# Patient Record
Sex: Female | Born: 1985 | Race: Black or African American | Hispanic: No | Marital: Single | Smoking: Current every day smoker
Health system: Southern US, Community
[De-identification: ages and names within clinical notes are randomized; demographics above are authoritative.]

## PROBLEM LIST (undated history)

## (undated) DIAGNOSIS — I471 Supraventricular tachycardia, unspecified: Secondary | ICD-10-CM

## (undated) DIAGNOSIS — R002 Palpitations: Secondary | ICD-10-CM

## (undated) DIAGNOSIS — H16009 Unspecified corneal ulcer, unspecified eye: Secondary | ICD-10-CM

## (undated) HISTORY — PX: TUBAL LIGATION: SHX77

## (undated) HISTORY — PX: ELBOW SURGERY: SHX618

---

## 2015-06-21 ENCOUNTER — Emergency Department (HOSPITAL_BASED_OUTPATIENT_CLINIC_OR_DEPARTMENT_OTHER)
Admission: EM | Admit: 2015-06-21 | Discharge: 2015-06-21 | Disposition: A | Discharge status: Home / Self Care | Payer: Medicaid Other | Attending: Emergency Medicine | Admitting: Emergency Medicine

## 2015-06-21 ENCOUNTER — Encounter (HOSPITAL_BASED_OUTPATIENT_CLINIC_OR_DEPARTMENT_OTHER): Payer: Self-pay | Admitting: Emergency Medicine

## 2015-06-21 DIAGNOSIS — R51 Headache: Secondary | ICD-10-CM | POA: Insufficient documentation

## 2015-06-21 DIAGNOSIS — Z720 Tobacco use: Secondary | ICD-10-CM | POA: Insufficient documentation

## 2015-06-21 DIAGNOSIS — R220 Localized swelling, mass and lump, head: Secondary | ICD-10-CM | POA: Diagnosis present

## 2015-06-21 DIAGNOSIS — H16001 Unspecified corneal ulcer, right eye: Secondary | ICD-10-CM

## 2015-06-21 HISTORY — DX: Unspecified corneal ulcer, unspecified eye: H16.009

## 2015-06-21 MED ORDER — HYDROCODONE-ACETAMINOPHEN 5-325 MG PO TABS: 2.0000 | ORAL_TABLET | ORAL | Status: DC | PRN

## 2015-06-21 MED ORDER — ONDANSETRON HCL 4 MG/2ML IJ SOLN
4.0000 mg | Freq: Once | INTRAMUSCULAR | Status: AC
Start: 2015-06-21 — End: 2015-06-21
  Administered 2015-06-21: 4 mg via INTRAVENOUS
  Filled 2015-06-21: qty 2

## 2015-06-21 MED ORDER — TETRACAINE HCL 0.5 % OP SOLN
OPHTHALMIC | Status: AC
  Administered 2015-06-21: 2 [drp] via OPHTHALMIC
  Filled 2015-06-21: qty 2

## 2015-06-21 MED ORDER — FLUORESCEIN SODIUM 1 MG OP STRP
1.0000 | ORAL_STRIP | Freq: Once | OPHTHALMIC | Status: AC
  Administered 2015-06-21: 1 via OPHTHALMIC

## 2015-06-21 MED ORDER — MORPHINE SULFATE (PF) 4 MG/ML IV SOLN
4.0000 mg | INTRAVENOUS | Status: DC | PRN
  Administered 2015-06-21: 4 mg via INTRAVENOUS

## 2015-06-21 MED ORDER — MORPHINE SULFATE (PF) 2 MG/ML IV SOLN
2.0000 mg | Freq: Once | INTRAVENOUS | Status: AC
  Administered 2015-06-21: 2 mg via INTRAVENOUS
  Filled 2015-06-21: qty 1

## 2015-06-21 MED ORDER — FLUORESCEIN SODIUM 1 MG OP STRP
ORAL_STRIP | OPHTHALMIC | Status: AC
Start: 2015-06-21 — End: 2015-06-21
  Administered 2015-06-21: 1 via OPHTHALMIC
  Filled 2015-06-21: qty 1

## 2015-06-21 MED ORDER — TETRACAINE HCL 0.5 % OP SOLN
1.0000 [drp] | Freq: Once | OPHTHALMIC | Status: AC
  Administered 2015-06-21: 2 [drp] via OPHTHALMIC

## 2015-06-21 MED ORDER — MORPHINE SULFATE (PF) 2 MG/ML IV SOLN
INTRAVENOUS | Status: AC
  Filled 2015-06-21: qty 1

## 2015-06-21 MED ORDER — MORPHINE SULFATE (PF) 4 MG/ML IV SOLN
4.0000 mg | INTRAVENOUS | Status: DC | PRN
  Filled 2015-06-21: qty 1

## 2015-06-21 NOTE — Discharge Instructions (Signed)
Good directly to Dr. Eliane Decree office. He will be there waiting for you.  Do not stop, do not eat, do not go home before going directly to his office.

## 2015-06-21 NOTE — ED Notes (Signed)
Pt reports pain to jaw Friday, yesterday she developed edema to right side of face, states she feels like her eye is going to fall out

## 2015-06-21 NOTE — ED Provider Notes (Signed)
CSN: [id number]     Arrival date & time 06/21/15  8295 History   First MD Initiated Contact with Patient 06/21/15 1005     Chief Complaint  Patient presents with  . Facial Swelling      HPI  Patient presents for evaluation of right eye pain and right-sided headache. She states that she noticed some symptoms on Friday. This was 2 days ago. Symptoms worsening yesterday with worsening right-sided eye pain. Overall pain and right-sided headache. Her vision became acutely poor yesterday. Rash lesions or vesicles across her face. She states it makes her feel like her whole head and neck are hurting. No fevers or chills. Had a history of similar episode treated with "drops and medications" a year or so ago she cannot recall whether this was herpetic lesion, ulcer, shingles. Cannot specify.   Past Medical History  Diagnosis Date  . Corneal ulcer    History reviewed. No pertinent past surgical history. History reviewed. No pertinent family history. Social History  Substance Use Topics  . Smoking status: Current Every Day Smoker -- 0.50 packs/day    Types: Cigarettes  . Smokeless tobacco: None  . Alcohol Use: No   OB History    No data available     Review of Systems  Constitutional: Negative for fever, chills, diaphoresis, appetite change and fatigue.  HENT: Negative for mouth sores, sore throat and trouble swallowing.        Headache right eye pain.  Eyes: Positive for photophobia, redness and visual disturbance.  Respiratory: Negative for cough, chest tightness, shortness of breath and wheezing.   Cardiovascular: Negative for chest pain.  Gastrointestinal: Negative for nausea, vomiting, abdominal pain, diarrhea and abdominal distention.  Endocrine: Negative for polydipsia, polyphagia and polyuria.  Genitourinary: Negative for dysuria, frequency and hematuria.  Musculoskeletal: Negative for gait problem.  Skin: Negative for color change, pallor and rash.  Neurological: Negative  for dizziness, syncope, light-headedness and headaches.  Hematological: Does not bruise/bleed easily.  Psychiatric/Behavioral: Negative for behavioral problems and confusion.      Allergies  Review of patient's allergies indicates no known allergies.  Home Medications   Prior to Admission medications   Medication Sig Start Date End Date Taking? Authorizing Provider  ibuprofen (ADVIL,MOTRIN) 200 MG tablet Take 200 mg by mouth every 6 (six) hours as needed.   Yes Historical Provider, MD  HYDROcodone-acetaminophen (NORCO/VICODIN) 5-325 MG per tablet Take 2 tablets by mouth every 4 (four) hours as needed. 06/21/15   Rolland Porter, MD   BP 115/72 mmHg  Pulse 85  Temp(Src) 98.7 F (37.1 C) (Oral)  Resp 16  Ht  (1.575 m)  Wt 149 lb (67.586 kg)  BMI 27.25 kg/m2  SpO2 98%  LMP 05/22/2015 Physical Exam  Constitutional: She is oriented to person, place, and time. She appears well-developed and well-nourished. No distress.  HENT:  Head: Normocephalic.  Eyes: Conjunctivae are normal. Pupils are equal, round, and reactive to light. No scleral icterus.  Slit lamp exam:      The right eye shows corneal ulcer and fluorescein uptake. The right eye shows no hyphema and no hypopyon.    Large corneal ulcer nearly occluding/covering the pupillary aperture in a dimly lit room.  Direct and consensual photophobia. No cloudiness of the anterior chamber. No cell or flare noted. Pupils reactive. Eye does not appear to be an acute angle closure, coma.  Neck: Normal range of motion. Neck supple. No thyromegaly present.  Cardiovascular: Normal rate and regular rhythm.  Exam reveals no gallop and no friction rub.   No murmur heard. Pulmonary/Chest: Effort normal and breath sounds normal. No respiratory distress. She has no wheezes. She has no rales.  Abdominal: Soft. Bowel sounds are normal. She exhibits no distension. There is no tenderness. There is no rebound.  Musculoskeletal: Normal range of motion.    Neurological: She is alert and oriented to person, place, and time.  Skin: Skin is warm and dry. No rash noted.  Psychiatric: She has a normal mood and affect. Her behavior is normal.    ED Course  Procedures (including critical care time) Labs Review Labs Reviewed - No data to display  Imaging Review No results found. I have personally reviewed and evaluated these images and lab results as part of my medical decision-making.   EKG Interpretation None      MDM   Final diagnoses:  Corneal ulcer, right    Patient is unable to complete visual acuity testing if she cannot identify any chart. She can identify colors movement and light. She has a large central ulcers does not appear dendritic. I discussed the case with Dr. Allena Katz. Dr. Allena Katz as always return my call immediately and was really helpful. He will see the patient in his office immediately. The patient was discharged to follow-up directly at his office. Asked to not stop, eat, or take any alternate routes, and going directly to his office for immediate evaluation of this large corneal ulcer.    Rolland Porter, MD 06/21/15 [phone]

## 2015-06-26 ENCOUNTER — Emergency Department (HOSPITAL_COMMUNITY)
Admission: EM | Admit: 2015-06-26 | Discharge: 2015-06-27 | Disposition: A | Discharge status: Home / Self Care | Payer: Medicaid Other | Attending: Emergency Medicine | Admitting: Emergency Medicine

## 2015-06-26 ENCOUNTER — Encounter (HOSPITAL_COMMUNITY): Payer: Self-pay | Admitting: Emergency Medicine

## 2015-06-26 DIAGNOSIS — Z720 Tobacco use: Secondary | ICD-10-CM | POA: Insufficient documentation

## 2015-06-26 DIAGNOSIS — H16001 Unspecified corneal ulcer, right eye: Secondary | ICD-10-CM | POA: Insufficient documentation

## 2015-06-26 DIAGNOSIS — Z792 Long term (current) use of antibiotics: Secondary | ICD-10-CM | POA: Diagnosis not present

## 2015-06-26 DIAGNOSIS — H5711 Ocular pain, right eye: Secondary | ICD-10-CM | POA: Diagnosis present

## 2015-06-26 DIAGNOSIS — G478 Other sleep disorders: Secondary | ICD-10-CM | POA: Diagnosis not present

## 2015-06-26 MED ORDER — DIAZEPAM 5 MG PO TABS
5.0000 mg | ORAL_TABLET | Freq: Once | ORAL | Status: AC
  Administered 2015-06-26: 5 mg via ORAL
  Filled 2015-06-26: qty 1

## 2015-06-26 MED ORDER — HYDROCODONE-ACETAMINOPHEN 5-325 MG PO TABS: 1.0000 | ORAL_TABLET | ORAL | Status: DC | PRN

## 2015-06-26 MED ORDER — HYDROMORPHONE HCL 1 MG/ML IJ SOLN
1.0000 mg | Freq: Once | INTRAMUSCULAR | Status: AC
  Administered 2015-06-26: 1 mg via INTRAMUSCULAR
  Filled 2015-06-26: qty 1

## 2015-06-26 MED ORDER — IBUPROFEN 200 MG PO TABS
600.0000 mg | ORAL_TABLET | Freq: Once | ORAL | Status: AC
  Administered 2015-06-26: 600 mg via ORAL
  Filled 2015-06-26: qty 3

## 2015-06-26 NOTE — Discharge Instructions (Signed)
Corneal Ulcer A corneal ulcer is an open sore on the cornea. The cornea is the clear covering at the front and center of the eye.  CAUSES  Most corneal ulcers are caused by infection, but there are other causes as well.  Bacterial infection. A bacterial infection can occur and cause a corneal ulcer if:  Contact lenses are worn too long (especially overnight) or are not properly cared for.  An eye injury occurs, allowing bacteria to infect the area of injury.  Viral infection. A viral infection can occur and cause a corneal ulcer if:  The eye becomes infected with a virus, such as the herpes simplex (cold sore) virus, chickenpox virus, or shingles virus.  Fungal infection. A fungal infection can occur and cause a corneal ulcer if:  An eye injury resulted from contact with a plant or plant material.  An anti-inflammatory eye drop is overused.  You have a weakened immune system.  Contact lenses are improperly cared for or become infected.  Foreign bodies in the eye, such as sand, glass, or small pieces of glass or metal.  Dry eyes.  Certain disorders that prevent eyelids from closing completely, such as Bell's palsy.  Contact lenses, especially extended-wear soft contact lenses. Contact lenses can:  Scratch the cornea's surface, allowing bacteria to enter the scratch.  Trap dirt underneath the contact lens, which can scratch the cornea.  Harbor bacteria and fungi, making it more likely for bacterial infections to occur.  Block oxygen from the cornea, making it more likely for infections to occur. SYMPTOMS   Eye pain that is often severe.  Blurry vision.  Light sensitivity.  Pus or thick discharge coming from your eye.  Eye redness.  Feeling like something is in your eye.  Watery or itchy eye.  Burning or stinging feeling. Some ulcers that are very big may be seen as a white spot on the cornea. DIAGNOSIS  An eye exam will be performed. Your health care provider  may use a special kind of microscope (slit lamp) to look at the cornea. Eye drops may be put into the eye to make the ulcer easier to see. If it is suspected that an infection caused the corneal ulcer, tissue samples or cultures from the eye may be taken. Numbing eye drops will be given before any samples or cultures are taken. The samples or cultures will be examined in the lab to check for bacteria, viruses, or fungi. TREATMENT  Treatment of the corneal ulcer depends on the cause. If your ulcer is severe, you may be given antibiotic eye drops up until your health care provider knows the test results. Other treatments can include:  Antibacterial, antiviral, or antifungal eye drops or ointment.  Removing the foreign body that caused the eye injury.  Artificial tears or a bandage contact lens if severe dry eyes caused the corneal ulcer.  Over-the-counter or prescription pain medicine.  Steroidal eye drops if the eye is inflamed and swollen.  Antibiotic medicines by mouth.  An injection of medicine under the thin membrane covering the eyeball (conjunctiva). This allows medicine to reach the ulcer in high doses.  Eye patching to reduce irritation from blinking and bright light. An eye patch may not be given if the ulcer was caused by a bacterial infection. If the corneal ulcer causes a scar on the cornea that interferes with vision, hospitalization and surgery may be needed to replace the cornea (corneal transplant). HOME CARE INSTRUCTIONS   If prescribed, use your antibiotic   pills, eye drops, or ointment as directed. Continue using them even if you start to feel better. You may have to apply eye drops as often as every few minutes to every hour, for days. It may be necessary to set your alarm clock every few minutes to every hour during the night. This is absolutely necessary.  Only take over-the-counter or prescription medicines as directed by your health care provider.  Apply artificial  tears as needed if you have dry eyes.  Do not touch or rub your eye, because this may increase the irritation and spread the infection.  Avoid wearing eye makeup.  Stay in a dark room and use sunglasses if you have light sensitivity.  Apply cool packs to your eye to relieve discomfort and swelling.  If your eye is patched, you should not drive or use machinery. You will have reduced side vision and ability to judge distance.  Do not drive or operate machinery until approved by your health care provider. Your ability to judge distances is impaired.  Follow up with your health care provider as directed.  Do not wear contact lenses until your health care provider approves. If you normally wear contact lenses, follow these general rules to avoid the risk of a corneal ulcer:  Do not wear contact lenses while you sleep.  Wash your hands before removing contact lenses.  Properly sterilize and store your contact lenses.  Regularly clean your contact lens case.  Do not use your saliva or tap water to clean or wet your contact lenses.  Remove your contact lenses if your eye becomes irritated. You may put them back in once your eyes feel better. SEEK IMMEDIATE MEDICAL CARE IF:   You notice a change in your vision.  Your pain is getting worse, not better.  You have increasing discharge from the eye. MAKE SURE YOU:   Understand these instructions.  Will watch your condition.  Will get help right away if you are not doing well or get worse. Document Released: 10/27/2004 Document Revised: 05/22/2013 Document Reviewed: 02/19/2013 [employer] Patient Information 2015 [employer]. This information is not intended to replace advice given to you by your health care provider. Make sure you discuss any questions you have with your health care provider.  

## 2015-06-26 NOTE — ED Notes (Signed)
Pt was seen at eye doctor's office on Wednesday. Was supposed to pick up pain medicine and an eye ointment but says, "When I went to pick up the eye ointment, they said something about my insurance and that it was too expensive. Nothing helps the pain and I don't know what to do. It's giving me a migraine." No other c/c.

## 2015-06-26 NOTE — ED Provider Notes (Signed)
CSN: [id number]     Arrival date & time 06/26/15  2201 History  This chart was scribed for Raeford Razor, MD by Placido Sou, ED scribe. This patient was seen in room WA12/[facility]12 and the patient's care was started at 11:06 PM.   Chief Complaint  Patient presents with  . Corneal Ulcer    . Eye Pain  . Migraine   The history is provided by the patient. No language interpreter was used.    HPI Comments: Tiffany Patterson is a 29 y.o. female who presents to the Emergency Department complaining of constant, moderate, right eye pain with onset 8 days ago. Pt was seen previously for her right eye pain 3 days ago and was diagnosed with a corneal ulcer and confirms taking some of her prescriptions including doxycycline and bacitracin. Pt notes that she still has a constant, moderate, associated HA, trouble sleeping as well as worsening symptoms when exposed to bright light. Pt notes taking hydrocodone for pain management with her last dose today at 3:00 pm which provided little relief of her pain. She notes contact lense use and further notes having a follow up appointment with her provider in 2 days. Pt denies any other associated symptoms.   Past Medical History  Diagnosis Date  . Corneal ulcer    History reviewed. No pertinent past surgical history. History reviewed. No pertinent family history. Social History  Substance Use Topics  . Smoking status: Current Every Day Smoker -- 0.50 packs/day    Types: Cigarettes  . Smokeless tobacco: None  . Alcohol Use: No   OB History    No data available     Review of Systems  Eyes: Positive for photophobia, pain and visual disturbance.  Neurological: Positive for headaches.  Psychiatric/Behavioral: Positive for sleep disturbance.  All other systems reviewed and are negative.  Allergies  Review of patient's allergies indicates no known allergies.  Home Medications   Prior to Admission medications   Medication Sig Start Date End Date Taking?  Authorizing Provider  doxycycline (VIBRA-TABS) 100 MG tablet Take 100 mg by mouth daily.   Yes Historical Provider, MD  KLOR-CON M20 20 MEQ tablet Take 40 mEq by mouth daily. 06/22/15  Yes Historical Provider, MD  metoprolol tartrate (LOPRESSOR) 25 MG tablet Take 25 mg by mouth 2 (two) times daily. 06/22/15  Yes Historical Provider, MD  VIGAMOX 0.5 % ophthalmic solution Place 1 drop into the right eye every hour. 06/21/15  Yes Historical Provider, MD  HYDROcodone-acetaminophen (NORCO/VICODIN) 5-325 MG per tablet Take 2 tablets by mouth every 4 (four) hours as needed. Patient not taking: Reported on 06/26/2015 06/21/15   Rolland Porter, MD   BP 107/74 mmHg  Pulse 82  Temp(Src) 98.2 F (36.8 C) (Oral)  Resp 20  SpO2 99%  LMP 05/22/2015 Physical Exam  Constitutional: She is oriented to person, place, and time. She appears well-developed and well-nourished.  HENT:  Head: Normocephalic and atraumatic.  Mouth/Throat: No oropharyngeal exudate.  Eyes:  Slit lamp exam:      The right eye shows corneal ulcer.  Large corneal ulcer right eye; conjunctival injection; mild upper lid edema; severe photophobia  Neck: Normal range of motion. No tracheal deviation present.  Cardiovascular: Normal rate.   Pulmonary/Chest: Effort normal. No respiratory distress.  Abdominal: Soft. There is no tenderness.  Musculoskeletal: Normal range of motion.  Neurological: She is alert and oriented to person, place, and time.  Skin: Skin is warm and dry. She is not diaphoretic.  Psychiatric: She  has a normal mood and affect. Her behavior is normal.  Nursing note and vitals reviewed.  ED Course  Procedures  DIAGNOSTIC STUDIES: Oxygen Saturation is 99% on RA, normal by my interpretation.    COORDINATION OF CARE: 11:15 PM Discussed treatment plan with pt at bedside and pt agreed to plan.  Labs Review Labs Reviewed - No data to display  Imaging Review No results found. I have personally reviewed and evaluated these  images and lab results as part of my medical decision-making.   EKG Interpretation None      MDM   Final diagnoses:  Corneal ulcer, right    28yF with corneal ulcer. Under opthalmology care. Pain poorly controlled. Additional meds added. Close outpt FU.  I personally preformed the services scribed in my presence. The recorded information has been reviewed is accurate. Raeford Razor, MD.    Raeford Razor, MD 07/04/15 [id number]

## 2015-06-27 MED ORDER — DIAZEPAM 5 MG PO TABS: 5.0000 mg | ORAL_TABLET | Freq: Three times a day (TID) | ORAL | Status: DC | PRN

## 2015-06-27 NOTE — ED Notes (Signed)
Pt. Ready for discharged, still in room awaiting for her Valium prescription, MD notified.

## 2015-11-06 ENCOUNTER — Emergency Department (HOSPITAL_BASED_OUTPATIENT_CLINIC_OR_DEPARTMENT_OTHER)
Admission: EM | Admit: 2015-11-06 | Discharge: 2015-11-06 | Disposition: A | Discharge status: Home / Self Care | Payer: Medicaid Other | Attending: Emergency Medicine | Admitting: Emergency Medicine

## 2015-11-06 ENCOUNTER — Emergency Department (HOSPITAL_BASED_OUTPATIENT_CLINIC_OR_DEPARTMENT_OTHER): Payer: Medicaid Other

## 2015-11-06 ENCOUNTER — Encounter (HOSPITAL_BASED_OUTPATIENT_CLINIC_OR_DEPARTMENT_OTHER): Payer: Self-pay

## 2015-11-06 DIAGNOSIS — Z79899 Other long term (current) drug therapy: Secondary | ICD-10-CM | POA: Diagnosis not present

## 2015-11-06 DIAGNOSIS — R079 Chest pain, unspecified: Secondary | ICD-10-CM | POA: Insufficient documentation

## 2015-11-06 DIAGNOSIS — Z8669 Personal history of other diseases of the nervous system and sense organs: Secondary | ICD-10-CM | POA: Diagnosis not present

## 2015-11-06 DIAGNOSIS — F17210 Nicotine dependence, cigarettes, uncomplicated: Secondary | ICD-10-CM | POA: Diagnosis not present

## 2015-11-06 DIAGNOSIS — Z8679 Personal history of other diseases of the circulatory system: Secondary | ICD-10-CM | POA: Insufficient documentation

## 2015-11-06 DIAGNOSIS — J069 Acute upper respiratory infection, unspecified: Secondary | ICD-10-CM

## 2015-11-06 DIAGNOSIS — R05 Cough: Secondary | ICD-10-CM | POA: Diagnosis present

## 2015-11-06 HISTORY — DX: Supraventricular tachycardia: I47.1

## 2015-11-06 HISTORY — DX: Palpitations: R00.2

## 2015-11-06 HISTORY — DX: Supraventricular tachycardia, unspecified: I47.10

## 2015-11-06 LAB — CBC WITH DIFFERENTIAL/PLATELET
BASOS ABS: 0 10*3/uL (ref 0.0–0.1)
BASOS PCT: 0 %
EOS PCT: 1 %
Eosinophils Absolute: 0.1 10*3/uL (ref 0.0–0.7)
HEMATOCRIT: 38.7 % (ref 36.0–46.0)
Hemoglobin: 13 g/dL (ref 12.0–15.0)
Lymphocytes Relative: 31 %
Lymphs Abs: 2.1 10*3/uL (ref 0.7–4.0)
MCH: 33.5 pg (ref 26.0–34.0)
MCHC: 33.6 g/dL (ref 30.0–36.0)
MCV: 99.7 fL (ref 78.0–100.0)
MONO ABS: 0.6 10*3/uL (ref 0.1–1.0)
Monocytes Relative: 8 %
NEUTROS ABS: 4.1 10*3/uL (ref 1.7–7.7)
Neutrophils Relative %: 60 %
PLATELETS: 209 10*3/uL (ref 150–400)
RBC: 3.88 MIL/uL (ref 3.87–5.11)
RDW: 11.6 % (ref 11.5–15.5)
WBC: 6.9 10*3/uL (ref 4.0–10.5)

## 2015-11-06 LAB — BASIC METABOLIC PANEL
ANION GAP: 7 (ref 5–15)
BUN: 12 mg/dL (ref 6–20)
CO2: 23 mmol/L (ref 22–32)
CREATININE: 0.6 mg/dL (ref 0.44–1.00)
Calcium: 8.9 mg/dL (ref 8.9–10.3)
Chloride: 109 mmol/L (ref 101–111)
GFR calc non Af Amer: >60 mL/min (ref >60)
Glucose, Bld: 88 mg/dL (ref 65–99)
Potassium: 4 mmol/L (ref 3.5–5.1)
Sodium: 139 mmol/L (ref 135–145)

## 2015-11-06 LAB — RAPID STREP SCREEN (MED CTR MEBANE ONLY): STREPTOCOCCUS, GROUP A SCREEN (DIRECT): NEGATIVE

## 2015-11-06 LAB — TROPONIN I: Troponin I: <0.03 ng/mL (ref <0.031)

## 2015-11-06 MED ORDER — BENZONATATE 100 MG PO CAPS: 100.0000 mg | ORAL_CAPSULE | Freq: Three times a day (TID) | ORAL | Status: AC

## 2015-11-06 MED ORDER — IBUPROFEN 800 MG PO TABS
800.0000 mg | ORAL_TABLET | Freq: Once | ORAL | Status: AC
Start: 2015-11-06 — End: 2015-11-06
  Administered 2015-11-06: 800 mg via ORAL
  Filled 2015-11-06: qty 1

## 2015-11-06 MED FILL — BENZONATATE 100 MG CAPSULE: 100 | 7 days supply | Qty: 21 | Fill #0

## 2015-11-06 NOTE — ED Notes (Addendum)
C/o dry cough, prod in the am, chills, sweats x 2 days-NAD-steady gait-also c/o CP x 2 days-hx of palpitations-was seen by cards last week for same-wore heart monitor x 2 days

## 2015-11-06 NOTE — Discharge Instructions (Signed)
1. Medications: tessalon, tylenol or motrin for pain, usual home medications 2. Treatment: rest, drink plenty of fluids 3. Follow Up: please followup with your primary doctor for discussion of your diagnoses and further evaluation after today's visit; if you do not have a primary care doctor use the resource guide provided to find one; please return to the ER for high fever, severe pain, shortness of breath, new or worsening symptoms   Upper Respiratory Infection, Adult Most upper respiratory infections (URIs) are a viral infection of the air passages leading to the lungs. A URI affects the nose, throat, and upper air passages. The most common type of URI is nasopharyngitis and is typically referred to as "the common cold." URIs run their course and usually go away on their own. Most of the time, a URI does not require medical attention, but sometimes a bacterial infection in the upper airways can follow a viral infection. This is called a secondary infection. Sinus and middle ear infections are common types of secondary upper respiratory infections. Bacterial pneumonia can also complicate a URI. A URI can worsen asthma and chronic obstructive pulmonary disease (COPD). Sometimes, these complications can require emergency medical care and may be life threatening.  CAUSES Almost all URIs are caused by viruses. A virus is a type of germ and can spread from one person to another.  RISKS FACTORS You may be at risk for a URI if:   You smoke.   You have chronic heart or lung disease.  You have a weakened defense (immune) system.   You are very young or very old.   You have nasal allergies or asthma.  You work in crowded or poorly ventilated areas.  You work in health care facilities or schools. SIGNS AND SYMPTOMS  Symptoms typically develop 2-3 days after you come in contact with a cold virus. Most viral URIs last 7-10 days. However, viral URIs from the influenza virus (flu virus) can last  14-18 days and are typically more severe. Symptoms may include:   Runny or stuffy (congested) nose.   Sneezing.   Cough.   Sore throat.   Headache.   Fatigue.   Fever.   Loss of appetite.   Pain in your forehead, behind your eyes, and over your cheekbones (sinus pain).  Muscle aches.  DIAGNOSIS  Your health care provider may diagnose a URI by:  Physical exam.  Tests to check that your symptoms are not due to another condition such as:  Strep throat.  Sinusitis.  Pneumonia.  Asthma. TREATMENT  A URI goes away on its own with time. It cannot be cured with medicines, but medicines may be prescribed or recommended to relieve symptoms. Medicines may help:  Reduce your fever.  Reduce your cough.  Relieve nasal congestion. HOME CARE INSTRUCTIONS   Take medicines only as directed by your health care provider.   Gargle warm saltwater or take cough drops to comfort your throat as directed by your health care provider.  Use a warm mist humidifier or inhale steam from a shower to increase air moisture. This may make it easier to breathe.  Drink enough fluid to keep your urine clear or pale yellow.   Eat soups and other clear broths and maintain good nutrition.   Rest as needed.   Return to work when your temperature has returned to normal or as your health care provider advises. You may need to stay home longer to avoid infecting others. You can also use a face mask  and careful hand washing to prevent spread of the virus.  Increase the usage of your inhaler if you have asthma.   Do not use any tobacco products, including cigarettes, chewing tobacco, or electronic cigarettes. If you need help quitting, ask your health care provider. PREVENTION  The best way to protect yourself from getting a cold is to practice good hygiene.   Avoid oral or hand contact with people with cold symptoms.   Wash your hands often if contact occurs.  There is no clear  evidence that vitamin C, vitamin E, echinacea, or exercise reduces the chance of developing a cold. However, it is always recommended to get plenty of rest, exercise, and practice good nutrition.  SEEK MEDICAL CARE IF:   You are getting worse rather than better.   Your symptoms are not controlled by medicine.   You have chills.  You have worsening shortness of breath.  You have brown or red mucus.  You have yellow or brown nasal discharge.  You have pain in your face, especially when you bend forward.  You have a fever.  You have swollen neck glands.  You have pain while swallowing.  You have white areas in the back of your throat. SEEK IMMEDIATE MEDICAL CARE IF:   You have severe or persistent:  Headache.  Ear pain.  Sinus pain.  Chest pain.  You have chronic lung disease and any of the following:  Wheezing.  Prolonged cough.  Coughing up blood.  A change in your usual mucus.  You have a stiff neck.  You have changes in your:  Vision.  Hearing.  Thinking.  Mood. MAKE SURE YOU:   Understand these instructions.  Will watch your condition.  Will get help right away if you are not doing well or get worse.   This information is not intended to replace advice given to you by your health care provider. Make sure you discuss any questions you have with your health care provider.   Document Released: 03/15/2001 Document Revised: 02/03/2015 Document Reviewed: 12/25/2013 Elsevier Interactive Patient Education 2016 [employer].   Emergency Department Resource Guide 1) Find a Doctor and Pay Out of Pocket Although you won't have to find out who is covered by your insurance plan, it is a good idea to ask around and get recommendations. You will then need to call the office and see if the doctor you have chosen will accept you as a new patient and what types of options they offer for patients who are self-pay. Some doctors offer discounts or will set up  payment plans for their patients who do not have insurance, but you will need to ask so you aren't surprised when you get to your appointment.  2) Contact Your Local Health Department Not all health departments have doctors that can see patients for sick visits, but many do, so it is worth a call to see if yours does. If you don't know where your local health department is, you can check in your phone book. The CDC also has a tool to help you locate your state's health department, and many state websites also have listings of all of their local health departments.  3) Find a Walk-in Clinic If your illness is not likely to be very severe or complicated, you may want to try a walk in clinic. These are popping up all over the country in pharmacies, drugstores, and shopping centers. They're usually staffed by nurse practitioners or physician assistants that have been  trained to treat common illnesses and complaints. They're usually fairly quick and inexpensive. However, if you have serious medical issues or chronic medical problems, these are probably not your best option.  No Primary Care Doctor: - Call Health Connect at  [phone] - they can help you locate a primary care doctor that  accepts your insurance, provides certain services, etc. - Physician Referral Service- [phone]  Chronic Pain Problems: Organization         Address  Phone   Notes  [facility]  [phone] Patients need to be referred by their primary care doctor.   Medication Assistance: Organization         Address  Phone   Notes  [facility] [address] [place] [place], [place] [address] [phone] --Must be a resident of [facility] -- Must have NO insurance coverage whatsoever (no Medicaid/ Medicare, etc.) -- The pt. MUST have a primary care doctor that directs their care regularly and follows them in the community   [facility]  [phone]   [employer]  [phone]    Agencies that provide inexpensive medical care: Organization         Address  Phone   Notes  [facility]  [phone]   [facility]    [phone]   [facility] [address] [place], [place] [address] [phone]   [facility] [address] [place] [place] [phone]   Planned Parenthood    [phone]   [facility]    [phone]   [facility]  [address], [place] Phone:  [phone], Fax:  [phone] Hours of Operation:  9 am - 6 pm, M-F.  Also accepts Medicaid/Medicare and self-pay.  [facility]  [address], [place] Phone: [phone], Fax: [phone]. Hours of Operation:  8:30 am - 5:30 pm, M-F.  Also accepts Medicaid and self-pay.  [facility] [place] [address], [place] Phone: [phone]   [facility] [address] Natasha Bence [place], [place] [phone], Ext. [phone] Mondays & Thursdays: 7-9 AM.  First 15 patients are seen on a first come, first serve basis.    Medicaid-accepting [facility] Providers:  Organization         Address  Phone   Notes  [facility] [address], [place] [phone] Also accepts self-pay patients.  [facility] [address] Laurell Josephs [place], [place]  [phone]   [facility] [address], [place] [phone]   [facility] [address] [place] [phone]   Renaye Rakers [address], [place]   [phone] Only accepts [place] Access [place] patients after they have their name applied to their card.   Self-Pay (no insurance) in [facility]:  Organization         Address  Phone   Notes  Sickle Cell Patients, [facility] [address] [place], [place]  [phone]   [facility] [address] [place], [place] [phone]   [facility] [place]  [address], [place] [phone]   [facility]/Dr. Osei-Bonsu  [address] [place] or [place]  [address] Laurell Josephs [address], [place] [phone] Phone number for both [facility] and [place] locations is the same.  [facility] [address], [place] [phone]   [facility] [address], [place] or [address] [phone]   [facility] [address] [place] [phone], phone; [phone], fax Sees patients 1st and 3rd Saturday of every month.  Must not qualify for public or private insurance (i.e. Medicaid, Medicare, [place] Health Choice, Veterans' Benefits)  Household income should be no more than 200% of the poverty level The clinic cannot treat you if you are pregnant or think you are pregnant  Sexually transmitted diseases are not treated at the clinic.    Dental Care: Organization         Address  Phone  Notes  [facility] of [facility] [address] [place], [place] [phone] Accepts children up to age 33 who are enrolled in [place] or [place] Health Choice; pregnant women with a Medicaid card; and children who have applied for Medicaid or [place] Health Choice, but were declined, whose parents can pay a reduced fee at time of service.  [facility] of [facility]  [address] [place] [address], [place] [phone] Accepts children up to age 52 who are enrolled in [place] or [place] Health Choice; pregnant women with a Medicaid card; and children who have applied for Medicaid or [place] Health Choice, but were declined, whose parents can pay a reduced fee at time of service.  [facility]  [address] [place], [place] [phone] Patients  are seen by appointment only. Walk-ins are not accepted. [facility] will see patients 67 years of age and older. Monday - Tuesday (8am-5pm) Most Wednesdays (8:30-5pm) $30 per visit, cash only  [facility]  [address], [facility] [phone] Patients are seen by appointment only. Walk-ins are not accepted. [facility] will see patients 33 years of age and older. One Wednesday Evening (Monthly: Volunteer Based).  $30 per visit, cash only  [employer]  [phone] for adults; Children under age 76, call Graduate Pediatric Dentistry at [phone]. Children aged 45-14, please call [phone] to request a pediatric application.  Dental services are provided in all areas of dental care including fillings, crowns and bridges, complete and partial dentures, implants, gum treatment, root canals, and extractions. Preventive care is also provided. Treatment is provided to both adults and children. Patients are selected via a lottery and there is often a waiting list.   [facility] [address], [place]  [phone] [email or web address]   [facility] [address] [place], [place] [phone], Ext. [phone] Second and Fourth Thursday of each month, opens at 6:30 AM; Clinic ends at 9 AM.  Patients are seen on a first-come first-served basis, and a limited number are seen during each clinic.   [facility]  [address] Ether Griffins [place], [place] [phone]   Eligibility Requirements You must have lived in [place], [place], or [place] counties for at least the last three months.   You cannot be eligible for state or federal sponsored [place], including [employer], [place], or [employer].   You generally cannot be eligible for healthcare insurance through your employer.    How to apply: Eligibility screenings are held  every Tuesday and Wednesday afternoon from 1:00 pm until  4:00 pm. You do not need an appointment for the interview!  [facility] [address] [place], [place] [phone]   [facility]  [phone]   [facility]  [phone]   [facility]  [phone]    Behavioral Health Resources in the Community: Intensive Outpatient Programs Organization         Address  Phone  Notes  [facility] [address], [place], [place] [phone]   [facility] Outpatient [address] [place], [place] [phone]   [facility] [address], [place], [place]  [phone]   [facility] [address]  [place], [place] [phone] or [phone]   Substance Abuse Resources Organization         Address  Phone  Notes  [facility]  [phone]   [facility]  [phone]   [facility]  [phone]   Floydene Flock  [phone]   Residential & Outpatient Substance Abuse Program  [phone]   Psychological Services Organization         Address  Phone  Notes  [facility]  [phone]   [facility]  [phone]   [facility] [address], [place] [phone] or [phone]    Mobile Crisis Teams Organization         Address  Phone  Notes  [facility], Mobile Crisis Care Unit  [phone]   [facility]  [address] [place], [place] [phone]   Doristine Locks [address] [place] [phone]    Self-Help/Support Groups Organization         Address  Phone             Notes  [facility] [place] - variety of support groups  336- I7437963 Call for more information  Narcotics Anonymous (NA), [facility] [address], [employer] [place]  2 meetings at this location   Statistician          Address  Phone  Notes  [facility] [address],    [place]  [phone]   [facility] [place] [facility]  [address] [place] [address], [place], [place] [phone]   [facility] [address] [place], [place] [phone] Admissions: 8am-3pm M-F  [facility] [address]    [place], [place] [phone]   [facility] [address] [place], [place], [place] [phone]   [facility] [address],  [place], [place] [phone]   [facility] - Intensive Outpatient [address] Laurell Josephs [address], [place], [place] [phone]   [facility] ([facility] [address] [place]  [place], [place] [phone] or [phone]   [facility] ([facility]) [address] [place], [place] [phone] Accepts Medicaid  [facility] [place] [address]  [place] [phone] Substance Abuse/Addiction Treatment   [facility] Organization         Address  Phone  Notes  [facility]  [phone]   Angie Fava, PhD [address] Ervin Knack [place], [place]   [phone] or [phone]   [facility] Behavioral   [address] [place], [place] [phone]   [facility] [address], [place], [place] [phone] Insurance/Medicaid/sponsorship through  [facility] [address] [place], [place] [phone] [place], [place] [phone]    Dr. Adele Schilder  [phone]   [facility] Dept. 1) [address] [place] 2) [place] 3)  [address], [place] [phone]  [phone]   [place] [phone] or [phone] (After Hours)

## 2015-11-06 NOTE — ED Provider Notes (Signed)
CSN: [id number]     Arrival date & time 11/06/15  1131 History   First MD Initiated Contact with Patient 11/06/15 1215     Chief Complaint  Patient presents with  . Cough  . Chest Pain    HPI   Tiffany Patterson is a 30 y.o. female with a PMH of SVT who presents to the ED with nasal congestion, nonproductive cough, chest pain, and shortness of breath, which she states has been present since Wednesday. She states her symptoms are constant. She reports coughing exacerbates her chest pain and shortness of breath. She has not tried anything for symptom relief. She denies fever, though reports chills. She also notes sore throat. She denies sick contact. She denies recent travel or immobility, recent surgery, history of malignancy, history of DVT or PE, estrogen use.   Past Medical History  Diagnosis Date  . Corneal ulcer   . Palpitations   . SVT (supraventricular tachycardia) [facility])    Past Surgical History  Procedure Laterality Date  . Elbow surgery    . Tubal ligation     No family history on file. Social History  Substance Use Topics  . Smoking status: Current Every Day Smoker -- 0.50 packs/day    Types: Cigarettes  . Smokeless tobacco: None  . Alcohol Use: No   OB History    No data available      Review of Systems  Constitutional: Positive for chills. Negative for fever.  HENT: Positive for congestion and sore throat.   Respiratory: Positive for cough and shortness of breath.   Cardiovascular: Positive for chest pain.  Gastrointestinal: Negative for nausea, vomiting, abdominal pain, diarrhea and constipation.  All other systems reviewed and are negative.     Allergies  Review of patient's allergies indicates no known allergies.  Home Medications   Prior to Admission medications   Medication Sig Start Date End Date Taking? Authorizing Provider  diphenhydramine-acetaminophen (TYLENOL PM) 25-500 MG TABS tablet Take 1 tablet by mouth at bedtime as needed.   Yes  Historical Provider, MD  benzonatate (TESSALON) 100 MG capsule Take 1 capsule (100 mg total) by mouth every 8 (eight) hours. 11/06/15   Mady Gemma, PA-C  metoprolol tartrate (LOPRESSOR) 25 MG tablet Take 25 mg by mouth 2 (two) times daily. 06/22/15   Historical Provider, MD    BP 143/87 mmHg  Pulse 94  Temp(Src) 98.5 F (36.9 C) (Oral)  Resp 20  Ht  (1.575 m)  Wt 71.215 kg  BMI 28.71 kg/m2  SpO2 100%  LMP 11/01/2015 Physical Exam  Constitutional: She is oriented to person, place, and time. She appears well-developed and well-nourished. No distress.  HENT:  Head: Normocephalic and atraumatic.  Right Ear: External ear normal.  Left Ear: External ear normal.  Nose: Nose normal.  Mouth/Throat: Uvula is midline, oropharynx is clear and moist and mucous membranes are normal.  Eyes: Conjunctivae, EOM and lids are normal. Pupils are equal, round, and reactive to light. Right eye exhibits no discharge. Left eye exhibits no discharge. No scleral icterus.  Neck: Normal range of motion. Neck supple.  Cardiovascular: Normal rate, regular rhythm, normal heart sounds, intact distal pulses and normal pulses.   Pulmonary/Chest: Effort normal and breath sounds normal. No respiratory distress. She has no wheezes. She has no rales.  Abdominal: Soft. Normal appearance and bowel sounds are normal. She exhibits no distension and no mass. There is no tenderness. There is no rigidity, no rebound and no guarding.  Musculoskeletal:  Normal range of motion. She exhibits no edema or tenderness.  Neurological: She is alert and oriented to person, place, and time.  Skin: Skin is warm, dry and intact. No rash noted. She is not diaphoretic. No erythema. No pallor.  Psychiatric: She has a normal mood and affect. Her speech is normal and behavior is normal.  Nursing note and vitals reviewed.   ED Course  Procedures (including critical care time)  Labs Review Labs Reviewed  RAPID STREP SCREEN (NOT AT  [facility])  CULTURE, GROUP A STREP ([facility])  CBC WITH DIFFERENTIAL/PLATELET  BASIC METABOLIC PANEL  TROPONIN I    Imaging Review Dg Chest 2 View  11/06/2015  CLINICAL DATA:  Cough.  Chest pain EXAM: CHEST  2 VIEW COMPARISON:  None. FINDINGS: The heart size and mediastinal contours are within normal limits. Both lungs are clear. The visualized skeletal structures are unremarkable. IMPRESSION: No active cardiopulmonary disease. Electronically Signed   By: Signa Kell M.D.   On: 11/06/2015 12:51     I have personally reviewed and evaluated these images and lab results as part of my medical decision-making.   EKG Interpretation   Date/Time:  Friday November 06 2015 11:44:58 EST Ventricular Rate:  105 PR Interval:  130 QRS Duration: 76 QT Interval:  344 QTC Calculation: 454 R Axis:   23 Text Interpretation:  Sinus tachycardia with occasional Premature  ventricular complexes Otherwise normal ECG No old tracing to compare  Confirmed by [facility]  MD, MARTHA [phone]) on 11/06/2015 11:52:46 AM      MDM   Final diagnoses:  URI (upper respiratory infection)    30 year old female presents with nasal congestion, nonproductive cough, chest pain, and shortness of breath since Wednesday. Denies fever, though reports chills. Also notes sore throat. Denies sick contact, recent travel or immobility, recent surgery, history of malignancy, history of DVT or PE, estrogen use.  Patient is afebrile. Vital signs stable. Posterior oropharynx without significant erythema, edema, or exudate. Heart regular rate and rhythm. Lungs clear to auscultation bilaterally. No lower extremity edema.  CBC negative for leukocytosis or anemia. BMP unremarkable. Rapid strep negative. EKG sinus tachycardia, heart rate 105, occasional PVC. Troponin negative. Chest x-ray no active cardiopulmonary disease.  Discussed findings with patient. She is nontoxic and well-appearing, feel she is stable for discharge at this time. Symptoms  likely viral. Low suspicion for ACS. Patient is PERC negative, doubt PE. Will give cough medication for her symptoms. Advised increased fluid intake. Patient to follow-up with PCP. Return precautions discussed. Patient verbalizes her understanding and is in agreement with plan.  BP 143/87 mmHg  Pulse 94  Temp(Src) 98.5 F (36.9 C) (Oral)  Resp 20  Ht  (1.575 m)  Wt 71.215 kg  BMI 28.71 kg/m2  SpO2 100%  LMP 11/01/2015      Mady Gemma, PA-C 11/06/15 1651  Jerelyn Scott, MD 11/09/15 [phone]

## 2015-11-09 LAB — CULTURE, GROUP A STREP (THRC)

## 2016-02-21 ENCOUNTER — Emergency Department (HOSPITAL_BASED_OUTPATIENT_CLINIC_OR_DEPARTMENT_OTHER)
Admission: EM | Admit: 2016-02-21 | Discharge: 2016-02-21 | Disposition: A | Discharge status: Home / Self Care | Payer: Medicaid Other | Attending: Dermatology | Admitting: Dermatology

## 2016-02-21 ENCOUNTER — Encounter (HOSPITAL_BASED_OUTPATIENT_CLINIC_OR_DEPARTMENT_OTHER): Payer: Self-pay | Admitting: Emergency Medicine

## 2016-02-21 DIAGNOSIS — Z5321 Procedure and treatment not carried out due to patient leaving prior to being seen by health care provider: Secondary | ICD-10-CM | POA: Insufficient documentation

## 2016-02-21 DIAGNOSIS — F17210 Nicotine dependence, cigarettes, uncomplicated: Secondary | ICD-10-CM | POA: Diagnosis not present

## 2016-02-21 DIAGNOSIS — R51 Headache: Secondary | ICD-10-CM | POA: Insufficient documentation

## 2016-02-21 NOTE — ED Notes (Signed)
Headache to right side x 1 week. No relief with tylenol / motrin. No significant hx of migraines/headaches.  Denies trauma. +photophobia

## 2016-10-22 IMAGING — CR DG CHEST 2V
2 series · 2 of 2 positions shown · non-contrast
Comparison: None.

CLINICAL DATA: Cough.  Chest pain

EXAM:
CHEST  2 VIEW

[w chest pa]
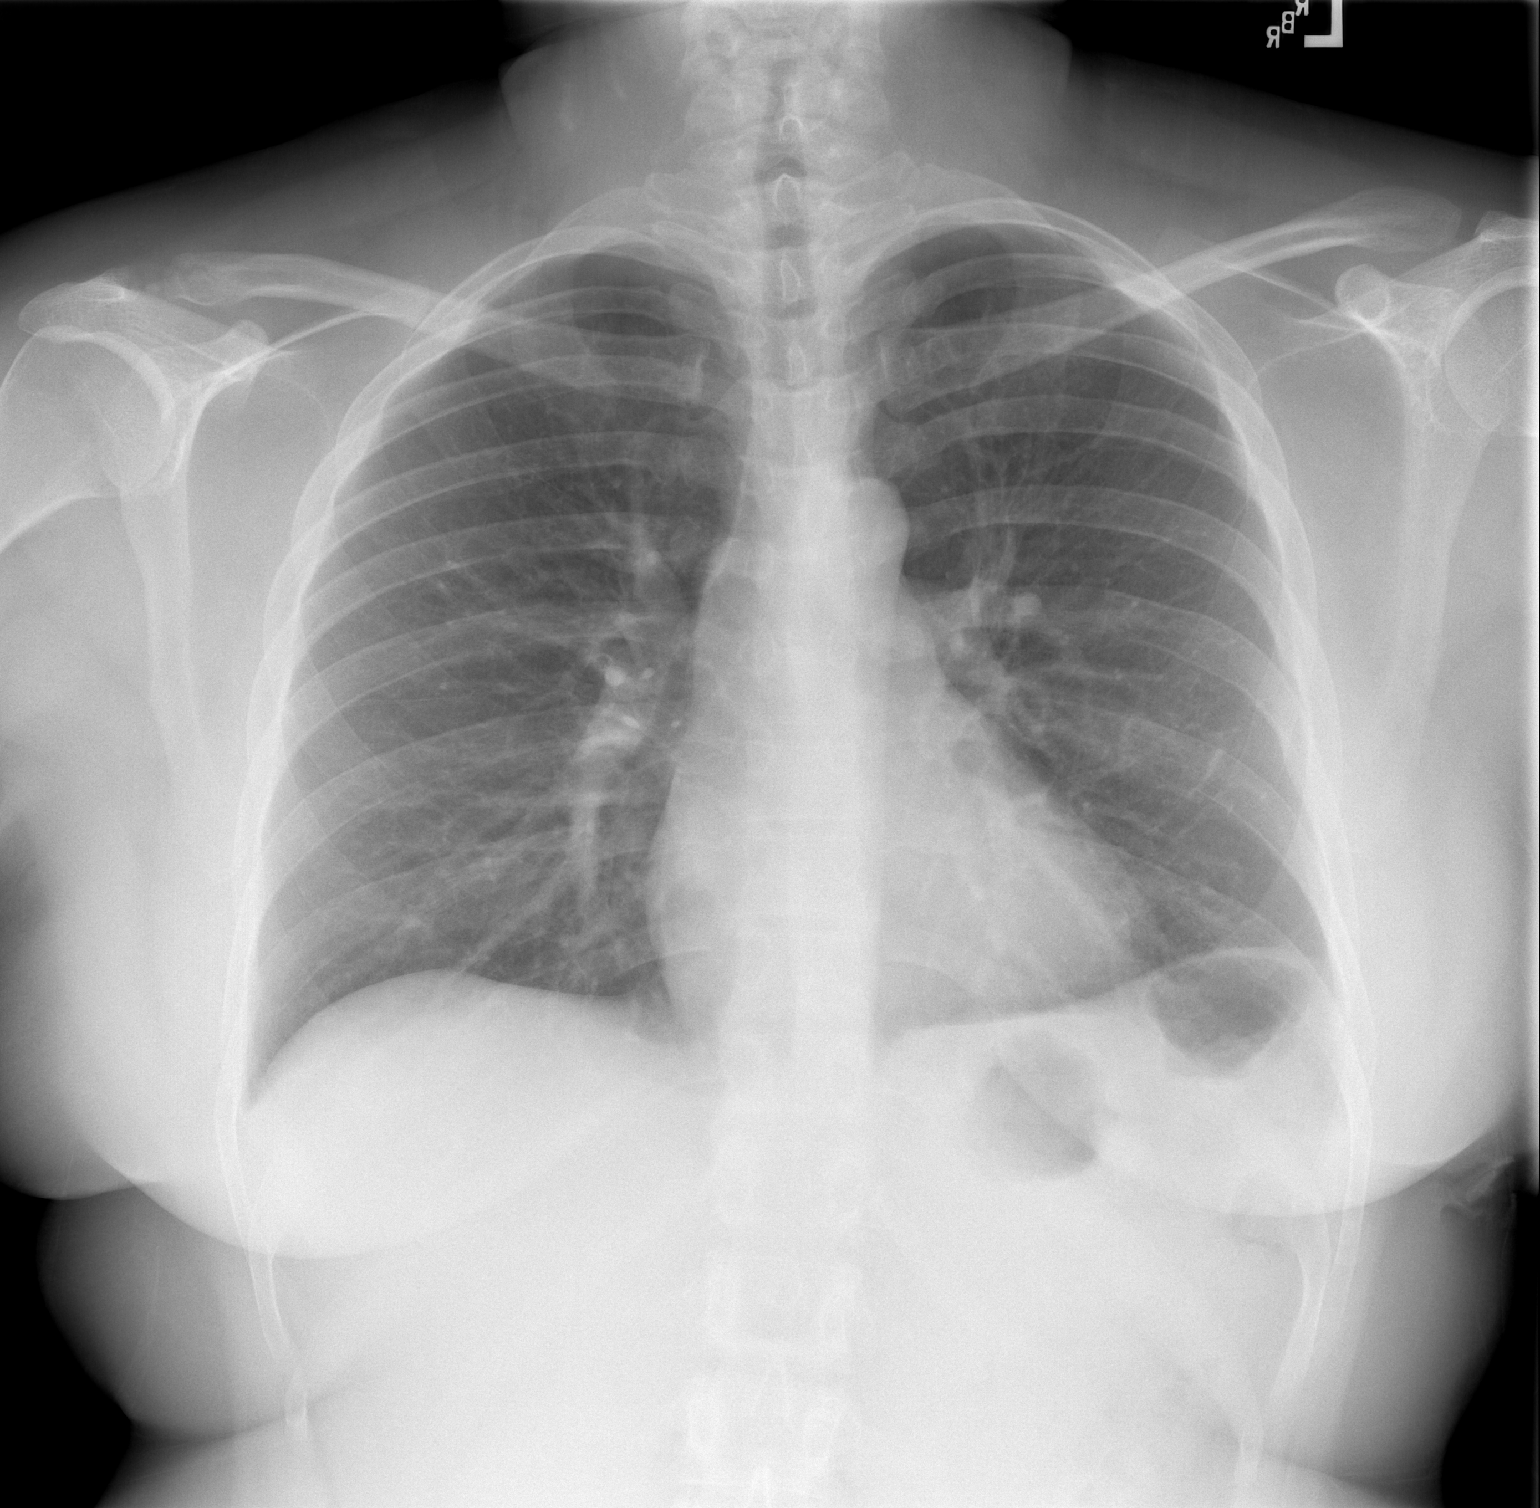

[w chest lat]
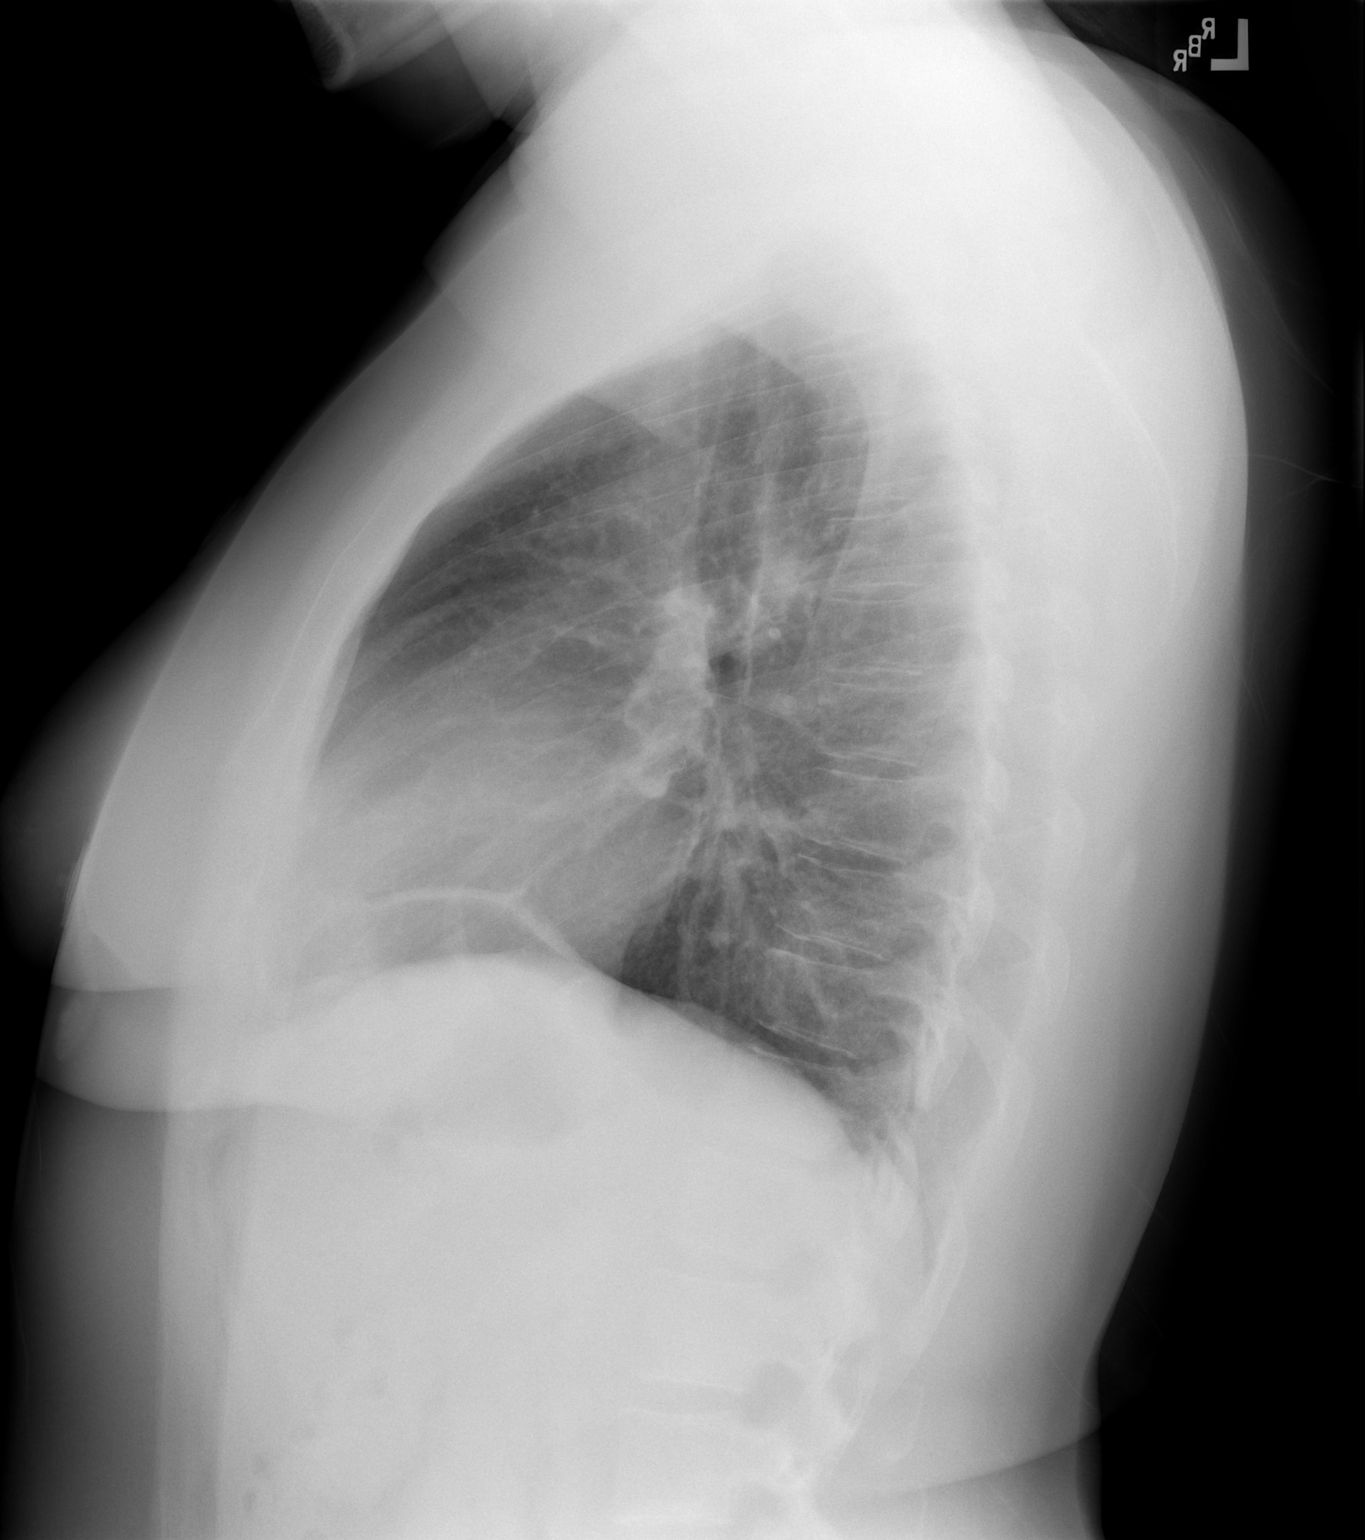

[2 of 2 positions shown; findings below may reference images not displayed]

FINDINGS: The heart size and mediastinal contours are within normal limits.
Both lungs are clear. The visualized skeletal structures are
unremarkable.
IMPRESSION: No active cardiopulmonary disease.

## 2023-06-25 ENCOUNTER — Emergency Department (HOSPITAL_COMMUNITY)
Admission: EM | Admit: 2023-06-25 | Discharge: 2023-06-25 | Discharge status: Against Medical Advice | Payer: Medicaid Other | Attending: Emergency Medicine | Admitting: Emergency Medicine

## 2023-06-25 ENCOUNTER — Encounter (HOSPITAL_COMMUNITY): Payer: Self-pay

## 2023-06-25 ENCOUNTER — Other Ambulatory Visit: Payer: Self-pay

## 2023-06-25 DIAGNOSIS — Z5321 Procedure and treatment not carried out due to patient leaving prior to being seen by health care provider: Secondary | ICD-10-CM | POA: Diagnosis not present

## 2023-06-25 DIAGNOSIS — R1032 Left lower quadrant pain: Secondary | ICD-10-CM | POA: Diagnosis present

## 2023-06-25 NOTE — ED Notes (Signed)
Pt left AMA due to long ED wait times.

## 2023-06-25 NOTE — ED Triage Notes (Signed)
Pt reports a pulsating feeling to left groin area, onset 3-4 days ago. Denies abdominal pain or any pain. Denies abnormal urinary symptoms. She reports she slept with a waist trainer on is and wondering if that could cause her symptoms.
